# Patient Record
Sex: Female | Born: 1945 | Race: Black or African American | Hispanic: No | Smoking: Current some day smoker
Health system: Southern US, Community
[De-identification: ages and names within clinical notes are randomized; demographics above are authoritative.]

---

## 2003-11-06 ENCOUNTER — Inpatient Hospital Stay (HOSPITAL_COMMUNITY): Admission: RE | Admit: 2003-11-06 | Discharge: 2003-11-09 | Payer: Self-pay | Admitting: Neurological Surgery

## 2003-11-19 ENCOUNTER — Encounter: Admission: RE | Admit: 2003-11-19 | Discharge: 2003-11-19 | Payer: Self-pay | Admitting: Neurological Surgery

## 2004-01-08 ENCOUNTER — Encounter: Admission: RE | Admit: 2004-01-08 | Discharge: 2004-01-08 | Payer: Self-pay | Admitting: Neurological Surgery

## 2004-03-03 ENCOUNTER — Encounter: Admission: RE | Admit: 2004-03-03 | Discharge: 2004-03-03 | Payer: Self-pay | Admitting: Neurological Surgery

## 2004-06-01 ENCOUNTER — Encounter: Admission: RE | Admit: 2004-06-01 | Discharge: 2004-06-01 | Payer: Self-pay | Admitting: Family Medicine

## 2005-02-01 ENCOUNTER — Ambulatory Visit: Payer: Self-pay | Admitting: Internal Medicine

## 2005-03-24 ENCOUNTER — Ambulatory Visit: Payer: Self-pay | Admitting: Unknown Physician Specialty

## 2005-09-19 IMAGING — CR DG CHEST 2V
2 series · 2 of 2 positions shown · non-contrast
Comparison: none

CLINICAL DATA: Preoperative respiratory evaluation, patient for spine surgery. 
 CHEST TWO VIEWS 
 PA and lateral views of the chest dated November 04, 2003 are reviewed without prior films for comparison.  Pulmonary vascularity in lung fields thought to be within normal limits.  
 IMPRESSION
 Negative chest for active disease.

[view not recorded (1 of 2)]
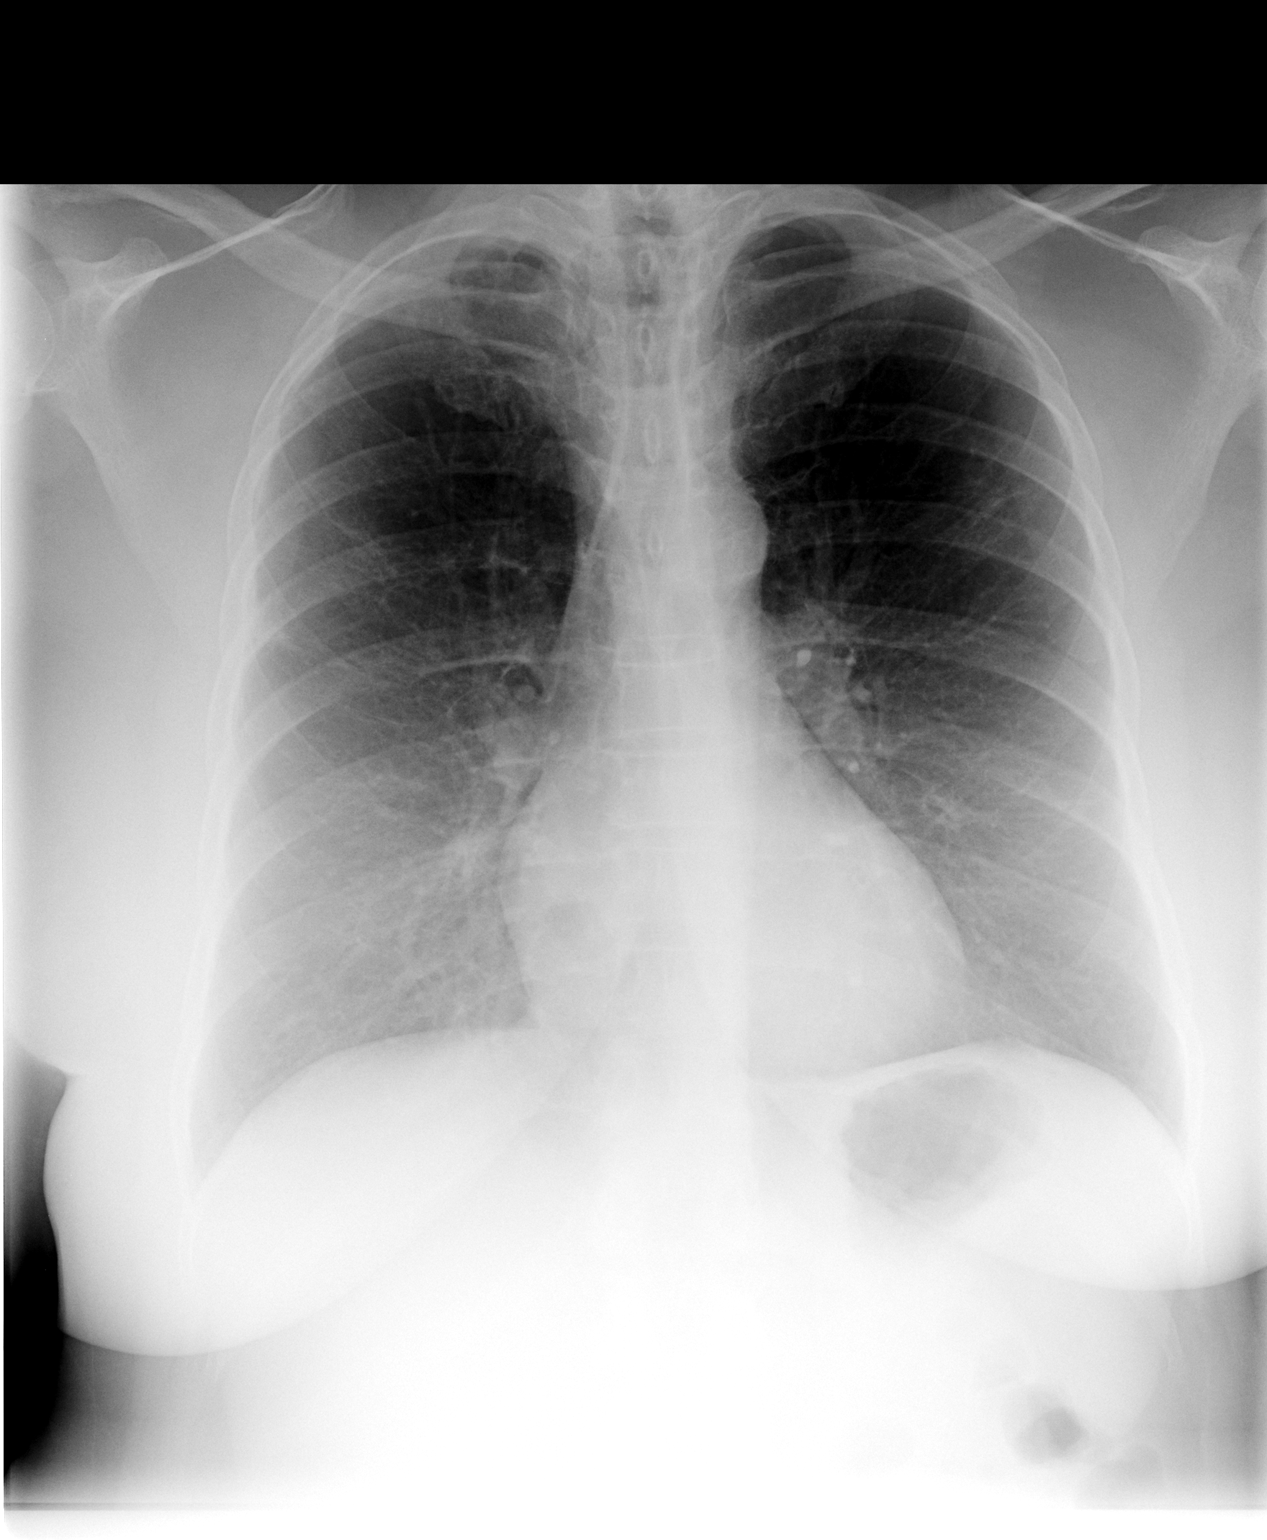

[view not recorded (2 of 2)]
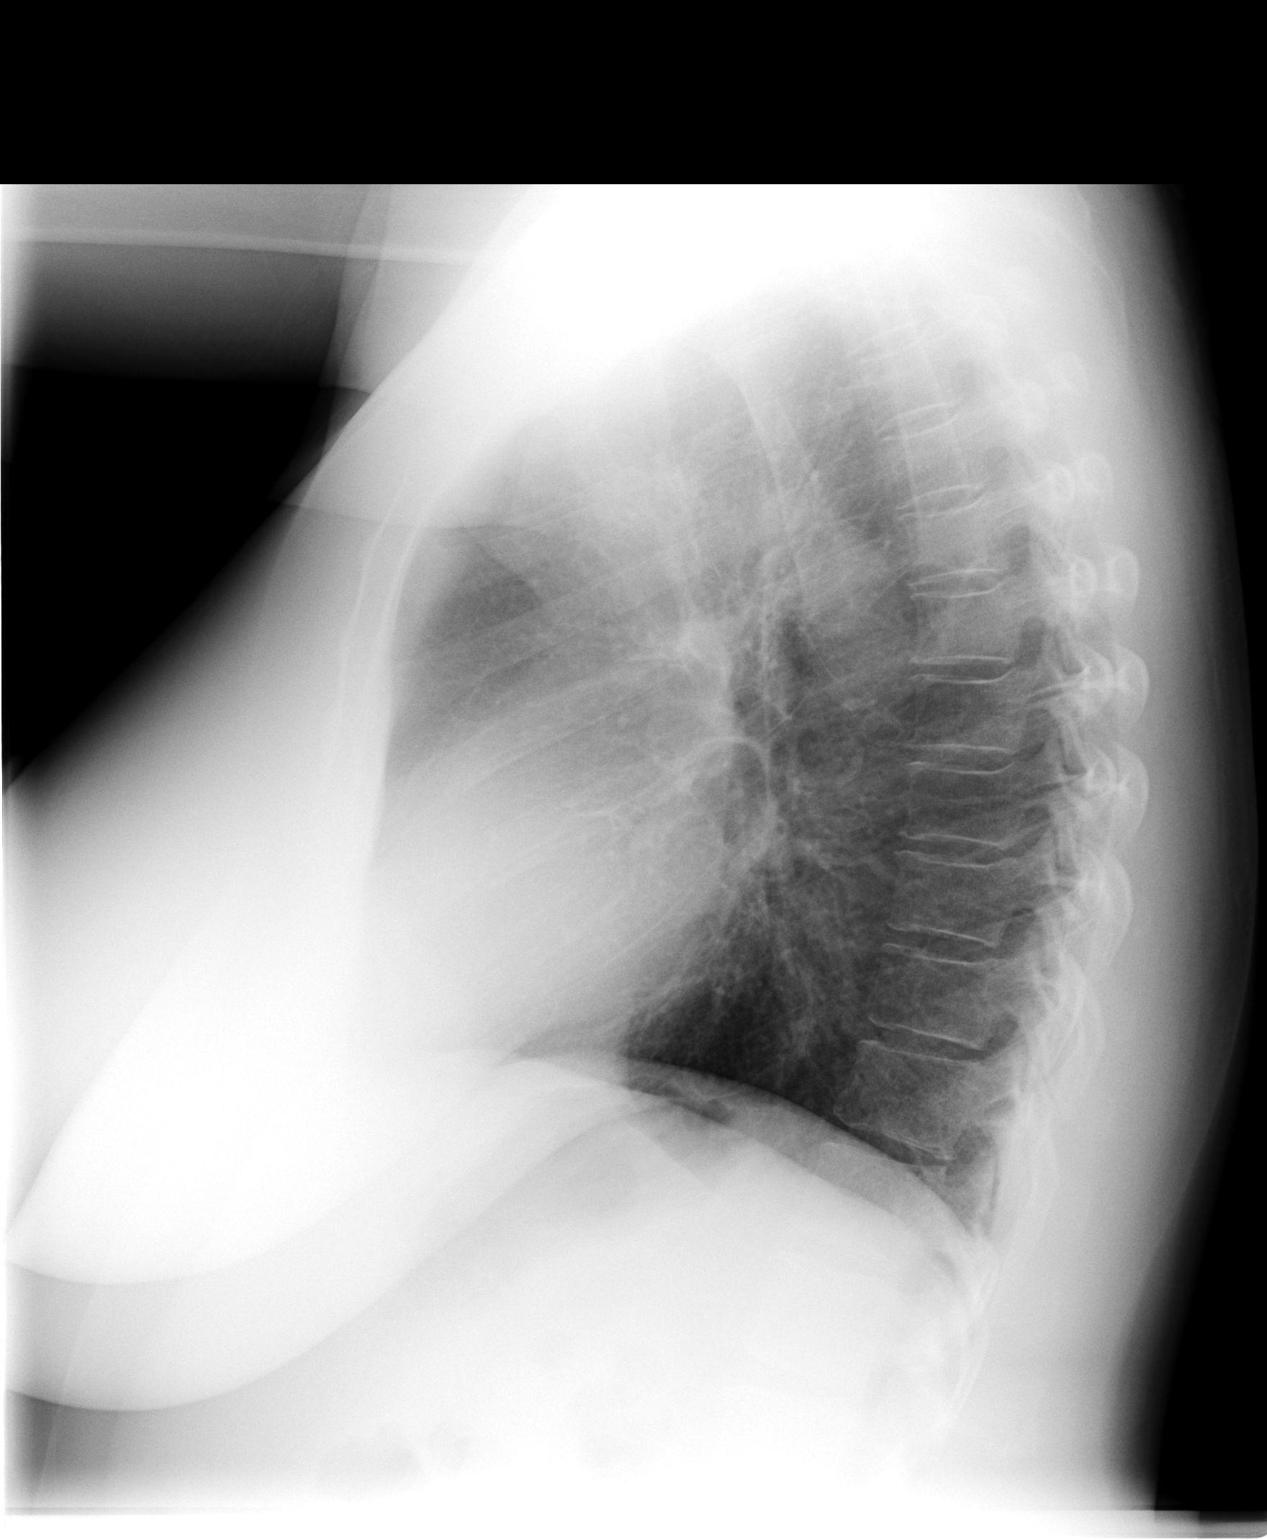

[2 of 2 positions shown; findings below may reference images not displayed]

## 2006-03-15 ENCOUNTER — Encounter: Admission: RE | Admit: 2006-03-15 | Discharge: 2006-03-15 | Payer: Self-pay | Admitting: Neurological Surgery

## 2007-09-20 ENCOUNTER — Ambulatory Visit: Payer: Self-pay | Admitting: Internal Medicine

## 2008-09-20 ENCOUNTER — Ambulatory Visit: Payer: Self-pay | Admitting: Internal Medicine

## 2009-05-06 ENCOUNTER — Ambulatory Visit: Payer: Self-pay | Admitting: Unknown Physician Specialty

## 2010-03-19 ENCOUNTER — Ambulatory Visit: Payer: Self-pay | Admitting: Internal Medicine

## 2011-09-01 ENCOUNTER — Ambulatory Visit: Payer: Self-pay | Admitting: Internal Medicine

## 2013-03-03 ENCOUNTER — Ambulatory Visit: Payer: Self-pay | Admitting: Emergency Medicine

## 2013-03-03 LAB — CBC WITH DIFFERENTIAL/PLATELET
Basophil #: 0 10*3/uL (ref 0.0–0.1)
Basophil %: 0.6 %
Eosinophil #: 0.1 10*3/uL (ref 0.0–0.7)
Eosinophil %: 1.4 %
HCT: 42.5 % (ref 35.0–47.0)
HGB: 14 g/dL (ref 12.0–16.0)
Lymphocyte #: 1.9 10*3/uL (ref 1.0–3.6)
Lymphocyte %: 33.7 %
MCH: 26.6 pg (ref 26.0–34.0)
MCHC: 33 g/dL (ref 32.0–36.0)
MCV: 81 fL (ref 80–100)
Monocyte #: 0.4 x10 3/mm (ref 0.2–0.9)
Monocyte %: 7.3 %
Neutrophil #: 3.3 10*3/uL (ref 1.4–6.5)
Neutrophil %: 57 %
Platelet: 145 10*3/uL — ABNORMAL LOW (ref 150–440)
RBC: 5.28 10*6/uL — ABNORMAL HIGH (ref 3.80–5.20)
RDW: 13.7 % (ref 11.5–14.5)
WBC: 5.7 10*3/uL (ref 3.6–11.0)

## 2013-03-03 LAB — COMPREHENSIVE METABOLIC PANEL
Albumin: 3.5 g/dL (ref 3.4–5.0)
Alkaline Phosphatase: 171 U/L — ABNORMAL HIGH (ref 50–136)
Anion Gap: 8 (ref 7–16)
BUN: 10 mg/dL (ref 7–18)
Bilirubin,Total: 0.3 mg/dL (ref 0.2–1.0)
Calcium, Total: 8.7 mg/dL (ref 8.5–10.1)
Chloride: 106 mmol/L (ref 98–107)
Co2: 27 mmol/L (ref 21–32)
Creatinine: 0.97 mg/dL (ref 0.60–1.30)
EGFR (African American): >60
EGFR (Non-African Amer.): >60
Glucose: 121 mg/dL — ABNORMAL HIGH (ref 65–99)
Osmolality: 282 (ref 275–301)
Potassium: 3.9 mmol/L (ref 3.5–5.1)
SGOT(AST): 15 U/L (ref 15–37)
SGPT (ALT): 25 U/L (ref 12–78)
Sodium: 141 mmol/L (ref 136–145)
Total Protein: 7.7 g/dL (ref 6.4–8.2)

## 2014-05-08 ENCOUNTER — Ambulatory Visit: Payer: Self-pay | Admitting: Unknown Physician Specialty

## 2014-09-16 LAB — SURGICAL PATHOLOGY

## 2019-06-03 ENCOUNTER — Emergency Department
Admission: EM | Admit: 2019-06-03 | Discharge: 2019-06-03 | Disposition: A | Discharge status: Home / Self Care | Payer: Medicare Other | Attending: Emergency Medicine | Admitting: Emergency Medicine

## 2019-06-03 ENCOUNTER — Emergency Department: Payer: Medicare Other

## 2019-06-03 ENCOUNTER — Encounter: Payer: Self-pay | Admitting: Emergency Medicine

## 2019-06-03 DIAGNOSIS — M79671 Pain in right foot: Secondary | ICD-10-CM | POA: Insufficient documentation

## 2019-06-03 MED ORDER — HYDROCODONE-ACETAMINOPHEN 5-325 MG PO TABS: 0.5000 | ORAL_TABLET | Freq: Four times a day (QID) | ORAL | 0 refills | Status: AC | PRN

## 2019-06-03 MED ORDER — HYDROCODONE-ACETAMINOPHEN 5-325 MG PO TABS
1.0000 | ORAL_TABLET | ORAL | Status: AC
  Administered 2019-06-03: 1 via ORAL
  Filled 2019-06-03: qty 1

## 2019-06-03 NOTE — Discharge Instructions (Signed)
Please call vein and vascular office tomorrow to schedule follow-up appointment.  Return to the ER for any worsening pain, bleeding, numbness, worsening symptoms or urgent changes in health.

## 2019-06-03 NOTE — ED Triage Notes (Signed)
Pt to ED via POV c/o right foot pain x 1 week. Pt states that foot is throbbing. Pt has some discoloration to toes, pt states that this has been going on for about a week as well, but noticed yesterday that her pinky toe had turned more black. Pt is in NAD.

## 2019-06-03 NOTE — ED Provider Notes (Signed)
[facility] EMERGENCY DEPARTMENT Provider Note   CSN: [id number] Arrival date & time: 06/03/19  1542     History Chief Complaint  Patient presents with  . Foot Pain    Martha Frye is a 74 y.o. female presents to the emergency department for evaluation of right foot pain.  She points to the great toe and little toe.  States about 6 weeks she stubbed the toes up against an object and developed some pain and discomfort.  Pain is recently gotten worse.  She denies any new trauma or injury.  No warmth redness or drainage.  She has noted bruising to the area.  She ambulates with a cane.  She has pain mostly at nighttime.  She denies any ankle pain, swelling.  Has a little bit of discomfort along the dorsum of the foot.  Pain increased with elevation, relieved with letting foot dangle.  HPI     History reviewed. No pertinent past medical history.  There are no problems to display for this patient.   Past Surgical History:  Procedure Laterality Date  . c-spine fuision   2005     OB History   No obstetric history on file.     No family history on file.  Social History   Tobacco Use  . Smoking status: Never Smoker  . Smokeless tobacco: Never Used  Substance Use Topics  . Alcohol use: Not Currently  . Drug use: Not Currently    Home Medications Prior to Admission medications   Medication Sig Start Date End Date Taking? Authorizing Provider  HYDROcodone-acetaminophen (NORCO) 5-325 MG tablet Take 0.5-1 tablets by mouth every 6 (six) hours as needed for moderate pain. 06/03/19   Evon Slack, PA-C    Allergies    Patient has no known allergies.  Review of Systems   Review of Systems  Constitutional: Negative for fever.  Respiratory: Negative for shortness of breath.   Cardiovascular: Negative for chest pain.  Musculoskeletal: Positive for arthralgias and gait problem. Negative for joint swelling.  Skin: Negative for rash and wound.      Physical Exam Updated Vital Signs BP (!) 131/95 (BP Location: Left Arm)   Pulse 66   Temp 98.4 F (36.9 C) (Oral)   Resp 16   SpO2 100%   Physical Exam Constitutional:      Appearance: She is well-developed.  HENT:     Head: Normocephalic and atraumatic.  Eyes:     Conjunctiva/sclera: Conjunctivae normal.  Cardiovascular:     Rate and Rhythm: Normal rate.  Pulmonary:     Effort: Pulmonary effort is normal. No respiratory distress.  Musculoskeletal:        General: Normal range of motion.     Cervical back: Normal range of motion.     Comments: Right foot with ecchymosis to the great toe and to the little toe.  Tenderness palpation to the great toe and little toe.  No skin breakdown noted.  Nails are intact.  Slightly tender to the dorsum of the foot along the metatarsals.  Normal sensation, warmth and cap refill.  Dorsalis pedis pulses present with Doppler.  Skin:    General: Skin is warm.     Findings: No rash.  Neurological:     Mental Status: She is alert and oriented to person, place, and time.  Psychiatric:        Behavior: Behavior normal.        Thought Content: Thought content normal.  ED Results / Procedures / Treatments   Labs (all labs ordered are listed, but only abnormal results are displayed) Labs Reviewed - No data to display  EKG None  Radiology DG Foot Complete Right  Result Date: 06/03/2019 CLINICAL DATA:  Trauma to great and little toes. EXAM: RIGHT FOOT COMPLETE - 3+ VIEW COMPARISON:  None. FINDINGS: There is no evidence of fracture or dislocation. There is no evidence of arthropathy or other focal bone abnormality. Soft tissues are unremarkable. IMPRESSION: Negative. Electronically Signed   By: Dorise Bullion III M.D   On: 06/03/2019 17:50    Procedures Procedures (including critical care time)  Medications Ordered in ED Medications - No data to display  ED Course  I have reviewed the triage vital signs and the nursing  notes.  Pertinent labs & imaging results that were available during my care of the patient were reviewed by me and considered in my medical decision making (see chart for details).    MDM Rules/Calculators/A&P                      74 year old female with 6 weeks of right foot pain to the great toe and little toe.  She has some bruising, states that she stubbed his toes up against a hard surface but x-rays show no fracture.  On exam hard time palpating DP pulses but Doppler showed pulses present.  Patient with increased pain with elevation and improved pain with letting foot dangle.  Some concern for claudication.  Will have patient follow-up with vein and vascular.  No open wounds or signs of cellulitis at this time. Final Clinical Impression(s) / ED Diagnoses Final diagnoses:  Foot pain, right    Rx / DC Orders ED Discharge Orders         Ordered    HYDROcodone-acetaminophen (NORCO) 5-325 MG tablet  Every 6 hours PRN     06/03/19 1828           Duanne Guess, PA-C 06/03/19 1830    Lavonia Drafts, MD 06/03/19 2201

## 2019-06-03 NOTE — ED Notes (Signed)
Pt denies hx/o diabetes. Right foot with warm and dry, pulse intact

## 2019-06-18 ENCOUNTER — Other Ambulatory Visit: Payer: Self-pay

## 2019-06-18 ENCOUNTER — Ambulatory Visit
Admission: EM | Admit: 2019-06-18 | Discharge: 2019-06-18 | Disposition: A | Discharge status: Home / Self Care | Payer: Medicare Other | Attending: Family Medicine | Admitting: Family Medicine

## 2019-06-18 DIAGNOSIS — R030 Elevated blood-pressure reading, without diagnosis of hypertension: Secondary | ICD-10-CM | POA: Diagnosis not present

## 2019-06-18 DIAGNOSIS — M79671 Pain in right foot: Secondary | ICD-10-CM | POA: Diagnosis not present

## 2019-06-18 DIAGNOSIS — I96 Gangrene, not elsewhere classified: Secondary | ICD-10-CM | POA: Diagnosis not present

## 2019-06-18 NOTE — ED Triage Notes (Signed)
Pt presents with c/o pain to right foot, last 2 toes. She reports this has been increasing for about 2 weeks. She has been to the ED for this and was given Lidocaine cream. She was not given antibiotics or other medications. She did have an xray which was negative. She denies having neuropathy in her LE. The last toe on the right foot is discolored and swollen and slightly bleeding. Pt reports no known injury to the foot. She is concerned about the toes as the discoloration is spreading to the other toes. She does report pain and difficulty with ambulation. She has had to start using a walker for assistance.

## 2019-06-18 NOTE — Discharge Instructions (Addendum)
Go directly to the emergency room

## 2019-06-18 NOTE — ED Provider Notes (Signed)
[facility] ____________________________________________  Time seen: Approximately 10:07 AM  I have reviewed the triage vital signs and the nursing notes.   HISTORY  Chief Complaint Toe Pain   HPI Martha Frye is a 74 y.o. female presenting for evaluation of right foot pain that has been ongoing for the last 2 to 3 weeks.  Patient reports no known injury.  States pain is increasing and is a constant aching but is worse with walking.  Patient has been seen in the ER twice at the beginning onset, but reports the dark coloration has worsened since previous evaluation.  Reports also that she has a blister on her fifth toe that has increased in size.  Denies history of anything similar in the past.  Denies known history of hypertension, diabetes, peripheral neuropathy or vascular issues.  Denies cough, fevers, chest pain or other complaints.  Reports otherwise doing well.  Has tried over-the-counter medication for the pain without resolution.  PCP: [facility]    History reviewed. No pertinent past medical history.  There are no problems to display for this patient.   Past Surgical History:  Procedure Laterality Date  . c-spine fuision   2005     No current facility-administered medications for this encounter.  Current Outpatient Medications:  .  HYDROcodone-acetaminophen (NORCO) 5-325 MG tablet, Take 0.5-1 tablets by mouth every 6 (six) hours as needed for moderate pain., Disp: 10 tablet, Rfl: 0  Allergies Patient has no known allergies.  Family History  Problem Relation Age of Onset  . Hypertension Mother   . Hypertension Father     Social History Social History   Tobacco Use  . Smoking status: Current Some Day Smoker    Packs/day: 0.25  . Smokeless tobacco: Never Used  Substance Use Topics  . Alcohol use: Not Currently  . Drug use: Not Currently    Review of Systems Constitutional: No fever. ENT: No sore throat. Cardiovascular: Denies  chest pain. Respiratory: Denies shortness of breath. Gastrointestinal: No abdominal pain.  Musculoskeletal: Positive right foot pain. Skin: Negative for rash. Neurological: Positive decreased sensation to right fifth toe.   ____________________________________________   PHYSICAL EXAM:  VITAL SIGNS: ED Triage Vitals  Enc Vitals Group     BP 06/18/19 0949 (!) 195/150     Pulse Rate 06/18/19 0949 83     Resp -- 18     Temp 06/18/19 0949 98.4 F (36.9 C)     Temp Source 06/18/19 0949 Oral     SpO2 06/18/19 0949 99 %     Weight 06/18/19 0946 159 lb (72.1 kg)     Height 06/18/19 0946 5' 4.5" (1.638 m)     Head Circumference --      Peak Flow --      Pain Score 06/18/19 0946 10     Pain Loc --      Pain Edu? --      Excl. in [place]? --    Vitals:   06/18/19 0946 06/18/19 0949 06/18/19 1021  BP:  (!) 195/150 (!) 161/88  Pulse:  83   Temp:  98.4 F (36.9 C)   TempSrc:  Oral   SpO2:  99%   Weight: 159 lb (72.1 kg)    Height: 5' 4.5" (1.638 m)       Constitutional: Alert and oriented. Well appearing and in no acute distress. Eyes: Conjunctivae are normal.  ENT      Head: Normocephalic and atraumatic. Cardiovascular: Normal rate, regular rhythm.  Grossly normal heart sounds.  Good peripheral circulation. Respiratory: Normal respiratory effort without tachypnea nor retractions. Breath sounds are clear and equal bilaterally. No wheezes, rales, rhonchi. Musculoskeletal:  Steady gait with walker.  Neurologic:  Normal speech and language.  Skin:  Skin is warm, dry.  Except: Right foot as depicted below.  Right fifth toe with darkened necrotic appearance with proximal dorsal erythema with fluid blister present, decreased sensation and diffuse pain, unable to evaluate capillary refill.  Right great toe with mild diffuse darkened ecchymotic coloration with normal distal sensation.  Dorsalis pedis pulses diminished by touch, verified with Doppler by nursing staff to be intact and  equal.       Psychiatric: Mood and affect are normal. Speech and behavior are normal. Patient exhibits appropriate insight and judgment   ___________________________________________   LABS (all labs ordered are listed, but only abnormal results are displayed)  Labs Reviewed - No data to display   PROCEDURES Procedures     INITIAL IMPRESSION / ASSESSMENT AND PLAN / ED COURSE  Pertinent labs & imaging results that were available during my care of the patient were reviewed by me and considered in my medical decision making (see chart for details).  Overall well-appearing patient.  Appearance of necrotic fifth toe with concern for secondary infection.  Recommend current evaluation emergency room.  Patient states that her family will take her directly to [facility].  Nursing staff verified bilateral distal feet pulses with hand-held Doppler and found to be intact and equal.  Patient agrees this plan.  Patient stable at discharge. ____________________________________________   FINAL CLINICAL IMPRESSION(S) / ED DIAGNOSES  Final diagnoses:  Necrotic toes (HCC)  Foot pain, right  Elevated blood pressure reading     ED Discharge Orders    None       Note: This dictation was prepared with Dragon dictation along with smaller phrase technology. Any transcriptional errors that result from this process are unintentional.         Renford Dills, NP 06/18/19 1055

## 2019-06-22 MED ORDER — GABAPENTIN 100 MG PO CAPS
100.00 | ORAL_CAPSULE | ORAL | Status: DC
Start: 2019-06-22 — End: 2019-06-22

## 2019-06-22 MED ORDER — GENERIC EXTERNAL MEDICATION
1.00 | Status: DC
Start: 2019-06-22 — End: 2019-06-22

## 2019-06-22 MED ORDER — GENERIC EXTERNAL MEDICATION
Status: DC
Start: ? — End: 2019-06-22

## 2019-06-22 MED ORDER — HYDRALAZINE HCL 20 MG/ML IJ SOLN
10.00 | INTRAMUSCULAR | Status: DC
Start: ? — End: 2019-06-22

## 2019-06-22 MED ORDER — ATORVASTATIN CALCIUM 80 MG PO TABS
80.00 | ORAL_TABLET | ORAL | Status: DC
Start: 2019-06-23 — End: 2019-06-22

## 2019-06-22 MED ORDER — ASPIRIN 81 MG PO CHEW
81.00 | CHEWABLE_TABLET | ORAL | Status: DC
Start: 2019-06-23 — End: 2019-06-22

## 2019-06-22 MED ORDER — ACETAMINOPHEN 325 MG PO TABS
650.00 | ORAL_TABLET | ORAL | Status: DC
Start: ? — End: 2019-06-22

## 2019-06-22 MED ORDER — NICOTINE 7 MG/24HR TD PT24
1.00 | MEDICATED_PATCH | TRANSDERMAL | Status: DC
Start: 2019-06-23 — End: 2019-06-22

## 2019-06-22 MED ORDER — METRONIDAZOLE 500 MG PO TABS
500.00 | ORAL_TABLET | ORAL | Status: DC
Start: 2019-06-22 — End: 2019-06-22

## 2019-06-22 MED ORDER — CLOPIDOGREL BISULFATE 75 MG PO TABS
75.00 | ORAL_TABLET | ORAL | Status: DC
Start: 2019-06-23 — End: 2019-06-22

## 2019-06-22 MED ORDER — NICOTINE POLACRILEX 4 MG MT LOZG
4.00 | LOZENGE | OROMUCOSAL | Status: DC
Start: ? — End: 2019-06-22

## 2019-08-27 ENCOUNTER — Other Ambulatory Visit: Payer: Self-pay | Admitting: Medical Oncology

## 2019-08-27 DIAGNOSIS — Z13820 Encounter for screening for osteoporosis: Secondary | ICD-10-CM

## 2019-08-27 DIAGNOSIS — Z780 Asymptomatic menopausal state: Secondary | ICD-10-CM

## 2019-08-31 DIAGNOSIS — S91301D Unspecified open wound, right foot, subsequent encounter: Secondary | ICD-10-CM | POA: Diagnosis not present

## 2019-08-31 DIAGNOSIS — Z89421 Acquired absence of other right toe(s): Secondary | ICD-10-CM | POA: Diagnosis not present

## 2019-08-31 DIAGNOSIS — I10 Essential (primary) hypertension: Secondary | ICD-10-CM | POA: Diagnosis not present

## 2019-08-31 DIAGNOSIS — G629 Polyneuropathy, unspecified: Secondary | ICD-10-CM | POA: Diagnosis not present

## 2019-08-31 DIAGNOSIS — I96 Gangrene, not elsewhere classified: Secondary | ICD-10-CM | POA: Diagnosis not present

## 2019-08-31 DIAGNOSIS — Z95820 Peripheral vascular angioplasty status with implants and grafts: Secondary | ICD-10-CM | POA: Diagnosis not present

## 2019-08-31 DIAGNOSIS — S91301A Unspecified open wound, right foot, initial encounter: Secondary | ICD-10-CM | POA: Diagnosis not present

## 2019-09-24 DIAGNOSIS — S91301A Unspecified open wound, right foot, initial encounter: Secondary | ICD-10-CM | POA: Diagnosis not present

## 2019-09-24 DIAGNOSIS — Z89421 Acquired absence of other right toe(s): Secondary | ICD-10-CM | POA: Diagnosis not present

## 2019-09-24 DIAGNOSIS — S91301D Unspecified open wound, right foot, subsequent encounter: Secondary | ICD-10-CM | POA: Diagnosis not present

## 2019-09-28 DIAGNOSIS — M79676 Pain in unspecified toe(s): Secondary | ICD-10-CM | POA: Diagnosis not present

## 2019-09-28 DIAGNOSIS — M79674 Pain in right toe(s): Secondary | ICD-10-CM | POA: Diagnosis not present

## 2019-09-28 DIAGNOSIS — I10 Essential (primary) hypertension: Secondary | ICD-10-CM | POA: Diagnosis not present

## 2019-09-28 DIAGNOSIS — B351 Tinea unguium: Secondary | ICD-10-CM | POA: Diagnosis not present

## 2019-09-28 DIAGNOSIS — Z9889 Other specified postprocedural states: Secondary | ICD-10-CM | POA: Diagnosis not present

## 2019-09-28 DIAGNOSIS — M79675 Pain in left toe(s): Secondary | ICD-10-CM | POA: Diagnosis not present

## 2019-09-28 DIAGNOSIS — G629 Polyneuropathy, unspecified: Secondary | ICD-10-CM | POA: Diagnosis not present

## 2019-09-28 DIAGNOSIS — Z87891 Personal history of nicotine dependence: Secondary | ICD-10-CM | POA: Diagnosis not present

## 2019-09-28 DIAGNOSIS — Z89421 Acquired absence of other right toe(s): Secondary | ICD-10-CM | POA: Diagnosis not present

## 2019-09-28 DIAGNOSIS — L84 Corns and callosities: Secondary | ICD-10-CM | POA: Diagnosis not present

## 2019-09-28 DIAGNOSIS — L97512 Non-pressure chronic ulcer of other part of right foot with fat layer exposed: Secondary | ICD-10-CM | POA: Diagnosis not present

## 2019-09-28 DIAGNOSIS — I739 Peripheral vascular disease, unspecified: Secondary | ICD-10-CM | POA: Diagnosis not present

## 2019-10-12 DIAGNOSIS — L97512 Non-pressure chronic ulcer of other part of right foot with fat layer exposed: Secondary | ICD-10-CM | POA: Diagnosis not present

## 2019-10-12 DIAGNOSIS — I96 Gangrene, not elsewhere classified: Secondary | ICD-10-CM | POA: Diagnosis not present

## 2019-10-12 DIAGNOSIS — G629 Polyneuropathy, unspecified: Secondary | ICD-10-CM | POA: Diagnosis not present

## 2019-10-12 DIAGNOSIS — Z89421 Acquired absence of other right toe(s): Secondary | ICD-10-CM | POA: Diagnosis not present

## 2019-10-12 DIAGNOSIS — I10 Essential (primary) hypertension: Secondary | ICD-10-CM | POA: Diagnosis not present

## 2019-10-12 DIAGNOSIS — T8789 Other complications of amputation stump: Secondary | ICD-10-CM | POA: Diagnosis not present

## 2019-11-09 DIAGNOSIS — I739 Peripheral vascular disease, unspecified: Secondary | ICD-10-CM | POA: Diagnosis not present

## 2019-11-09 DIAGNOSIS — L84 Corns and callosities: Secondary | ICD-10-CM | POA: Diagnosis not present

## 2019-11-09 DIAGNOSIS — Z9889 Other specified postprocedural states: Secondary | ICD-10-CM | POA: Diagnosis not present

## 2019-11-09 DIAGNOSIS — G629 Polyneuropathy, unspecified: Secondary | ICD-10-CM | POA: Diagnosis not present

## 2019-11-09 DIAGNOSIS — Z89421 Acquired absence of other right toe(s): Secondary | ICD-10-CM | POA: Diagnosis not present

## 2019-11-09 DIAGNOSIS — I10 Essential (primary) hypertension: Secondary | ICD-10-CM | POA: Diagnosis not present

## 2019-11-13 DIAGNOSIS — Z9181 History of falling: Secondary | ICD-10-CM | POA: Diagnosis not present

## 2019-11-13 DIAGNOSIS — Z89421 Acquired absence of other right toe(s): Secondary | ICD-10-CM | POA: Diagnosis not present

## 2019-11-13 DIAGNOSIS — Z1159 Encounter for screening for other viral diseases: Secondary | ICD-10-CM | POA: Diagnosis not present

## 2019-11-13 DIAGNOSIS — E782 Mixed hyperlipidemia: Secondary | ICD-10-CM | POA: Diagnosis not present

## 2019-11-13 DIAGNOSIS — I10 Essential (primary) hypertension: Secondary | ICD-10-CM | POA: Diagnosis not present

## 2019-11-13 DIAGNOSIS — Z87891 Personal history of nicotine dependence: Secondary | ICD-10-CM | POA: Diagnosis not present

## 2019-11-29 DIAGNOSIS — R7309 Other abnormal glucose: Secondary | ICD-10-CM | POA: Diagnosis not present

## 2019-11-29 DIAGNOSIS — Z1159 Encounter for screening for other viral diseases: Secondary | ICD-10-CM | POA: Diagnosis not present

## 2019-11-29 DIAGNOSIS — R7989 Other specified abnormal findings of blood chemistry: Secondary | ICD-10-CM | POA: Diagnosis not present

## 2019-12-28 DIAGNOSIS — R5381 Other malaise: Secondary | ICD-10-CM | POA: Diagnosis not present

## 2019-12-28 DIAGNOSIS — M24572 Contracture, left ankle: Secondary | ICD-10-CM | POA: Diagnosis not present

## 2019-12-28 DIAGNOSIS — R2689 Other abnormalities of gait and mobility: Secondary | ICD-10-CM | POA: Diagnosis not present

## 2019-12-28 DIAGNOSIS — M24571 Contracture, right ankle: Secondary | ICD-10-CM | POA: Diagnosis not present

## 2020-03-03 DIAGNOSIS — Z1211 Encounter for screening for malignant neoplasm of colon: Secondary | ICD-10-CM | POA: Diagnosis not present

## 2020-03-03 DIAGNOSIS — D649 Anemia, unspecified: Secondary | ICD-10-CM | POA: Diagnosis not present

## 2020-03-03 DIAGNOSIS — R7309 Other abnormal glucose: Secondary | ICD-10-CM | POA: Diagnosis not present

## 2020-03-03 DIAGNOSIS — E782 Mixed hyperlipidemia: Secondary | ICD-10-CM | POA: Diagnosis not present

## 2020-03-03 DIAGNOSIS — Z780 Asymptomatic menopausal state: Secondary | ICD-10-CM | POA: Diagnosis not present

## 2020-03-03 DIAGNOSIS — I10 Essential (primary) hypertension: Secondary | ICD-10-CM | POA: Diagnosis not present

## 2020-03-03 DIAGNOSIS — Z1231 Encounter for screening mammogram for malignant neoplasm of breast: Secondary | ICD-10-CM | POA: Diagnosis not present

## 2020-03-03 DIAGNOSIS — Z13820 Encounter for screening for osteoporosis: Secondary | ICD-10-CM | POA: Diagnosis not present

## 2020-03-03 DIAGNOSIS — Z23 Encounter for immunization: Secondary | ICD-10-CM | POA: Diagnosis not present

## 2020-03-03 DIAGNOSIS — R7989 Other specified abnormal findings of blood chemistry: Secondary | ICD-10-CM | POA: Diagnosis not present

## 2020-04-07 DIAGNOSIS — H2513 Age-related nuclear cataract, bilateral: Secondary | ICD-10-CM | POA: Diagnosis not present

## 2020-07-30 ENCOUNTER — Other Ambulatory Visit: Payer: Self-pay | Admitting: Internal Medicine

## 2020-07-30 DIAGNOSIS — E611 Iron deficiency: Secondary | ICD-10-CM | POA: Diagnosis not present

## 2020-07-30 DIAGNOSIS — Z1329 Encounter for screening for other suspected endocrine disorder: Secondary | ICD-10-CM | POA: Diagnosis not present

## 2020-07-30 DIAGNOSIS — R748 Abnormal levels of other serum enzymes: Secondary | ICD-10-CM | POA: Diagnosis not present

## 2020-07-30 DIAGNOSIS — Z89421 Acquired absence of other right toe(s): Secondary | ICD-10-CM | POA: Diagnosis not present

## 2020-07-30 DIAGNOSIS — I739 Peripheral vascular disease, unspecified: Secondary | ICD-10-CM | POA: Diagnosis not present

## 2020-07-30 DIAGNOSIS — Z1231 Encounter for screening mammogram for malignant neoplasm of breast: Secondary | ICD-10-CM | POA: Diagnosis not present

## 2020-07-30 DIAGNOSIS — I10 Essential (primary) hypertension: Secondary | ICD-10-CM | POA: Diagnosis not present

## 2020-07-30 DIAGNOSIS — R809 Proteinuria, unspecified: Secondary | ICD-10-CM | POA: Diagnosis not present

## 2020-07-30 DIAGNOSIS — R7303 Prediabetes: Secondary | ICD-10-CM | POA: Diagnosis not present

## 2020-07-30 DIAGNOSIS — Z0000 Encounter for general adult medical examination without abnormal findings: Secondary | ICD-10-CM | POA: Diagnosis not present

## 2020-07-30 DIAGNOSIS — Z720 Tobacco use: Secondary | ICD-10-CM | POA: Diagnosis not present

## 2020-08-04 ENCOUNTER — Other Ambulatory Visit: Payer: Self-pay | Admitting: Physician Assistant

## 2020-08-04 DIAGNOSIS — R809 Proteinuria, unspecified: Secondary | ICD-10-CM

## 2020-08-04 DIAGNOSIS — R748 Abnormal levels of other serum enzymes: Secondary | ICD-10-CM

## 2020-08-18 ENCOUNTER — Ambulatory Visit
Admission: RE | Admit: 2020-08-18 | Discharge: 2020-08-18 | Disposition: A | Discharge status: Home / Self Care | Payer: Medicare HMO | Source: Ambulatory Visit | Attending: Physician Assistant | Admitting: Physician Assistant

## 2020-08-18 ENCOUNTER — Other Ambulatory Visit: Payer: Self-pay

## 2020-08-18 DIAGNOSIS — R809 Proteinuria, unspecified: Secondary | ICD-10-CM | POA: Diagnosis not present

## 2020-08-18 DIAGNOSIS — R748 Abnormal levels of other serum enzymes: Secondary | ICD-10-CM | POA: Insufficient documentation

## 2020-08-18 DIAGNOSIS — K8020 Calculus of gallbladder without cholecystitis without obstruction: Secondary | ICD-10-CM | POA: Diagnosis not present

## 2020-08-21 ENCOUNTER — Other Ambulatory Visit: Payer: Self-pay | Admitting: Physician Assistant

## 2020-08-21 DIAGNOSIS — Z1231 Encounter for screening mammogram for malignant neoplasm of breast: Secondary | ICD-10-CM

## 2020-08-21 DIAGNOSIS — R748 Abnormal levels of other serum enzymes: Secondary | ICD-10-CM

## 2020-08-21 DIAGNOSIS — R809 Proteinuria, unspecified: Secondary | ICD-10-CM

## 2020-08-21 DIAGNOSIS — Z780 Asymptomatic menopausal state: Secondary | ICD-10-CM

## 2020-09-10 ENCOUNTER — Ambulatory Visit
Admission: RE | Admit: 2020-09-10 | Discharge: 2020-09-10 | Disposition: A | Discharge status: Home / Self Care | Payer: Medicare Other | Source: Ambulatory Visit | Attending: Physician Assistant | Admitting: Physician Assistant

## 2020-09-10 ENCOUNTER — Other Ambulatory Visit: Payer: Self-pay

## 2020-09-10 DIAGNOSIS — Z1231 Encounter for screening mammogram for malignant neoplasm of breast: Secondary | ICD-10-CM | POA: Insufficient documentation

## 2020-09-10 DIAGNOSIS — Z780 Asymptomatic menopausal state: Secondary | ICD-10-CM | POA: Insufficient documentation

## 2021-10-13 ENCOUNTER — Other Ambulatory Visit: Payer: Self-pay | Admitting: Family Medicine

## 2021-10-13 DIAGNOSIS — Z1231 Encounter for screening mammogram for malignant neoplasm of breast: Secondary | ICD-10-CM

## 2021-11-12 ENCOUNTER — Ambulatory Visit
Admission: RE | Admit: 2021-11-12 | Discharge: 2021-11-12 | Disposition: A | Discharge status: Home / Self Care | Payer: Medicare Other | Source: Ambulatory Visit | Attending: Family Medicine | Admitting: Family Medicine

## 2021-11-12 DIAGNOSIS — Z1231 Encounter for screening mammogram for malignant neoplasm of breast: Secondary | ICD-10-CM | POA: Diagnosis present
